# Patient Record
Sex: Female | Born: 1935 | Race: White | Hispanic: No | Smoking: Never smoker
Health system: Southern US, Community
[De-identification: ages and names within clinical notes are randomized; demographics above are authoritative.]

---

## 2015-10-23 ENCOUNTER — Emergency Department (HOSPITAL_COMMUNITY)
Admission: EM | Admit: 2015-10-23 | Discharge: 2015-10-23 | Disposition: A | Discharge status: Home / Self Care | Payer: Medicare Other | Attending: Emergency Medicine | Admitting: Emergency Medicine

## 2015-10-23 ENCOUNTER — Encounter (HOSPITAL_COMMUNITY): Payer: Self-pay | Admitting: Emergency Medicine

## 2015-10-23 DIAGNOSIS — Y9301 Activity, walking, marching and hiking: Secondary | ICD-10-CM | POA: Diagnosis not present

## 2015-10-23 DIAGNOSIS — Y998 Other external cause status: Secondary | ICD-10-CM | POA: Insufficient documentation

## 2015-10-23 DIAGNOSIS — Y9289 Other specified places as the place of occurrence of the external cause: Secondary | ICD-10-CM | POA: Insufficient documentation

## 2015-10-23 DIAGNOSIS — S0990XA Unspecified injury of head, initial encounter: Secondary | ICD-10-CM | POA: Diagnosis present

## 2015-10-23 DIAGNOSIS — W01198A Fall on same level from slipping, tripping and stumbling with subsequent striking against other object, initial encounter: Secondary | ICD-10-CM | POA: Insufficient documentation

## 2015-10-23 DIAGNOSIS — T148XXA Other injury of unspecified body region, initial encounter: Secondary | ICD-10-CM

## 2015-10-23 DIAGNOSIS — S0191XA Laceration without foreign body of unspecified part of head, initial encounter: Secondary | ICD-10-CM | POA: Insufficient documentation

## 2015-10-23 DIAGNOSIS — W19XXXA Unspecified fall, initial encounter: Secondary | ICD-10-CM

## 2015-10-23 MED ORDER — HYDROCODONE-ACETAMINOPHEN 5-325 MG PO TABS: 1.0000 | ORAL_TABLET | Freq: Once | ORAL | Status: DC

## 2015-10-23 NOTE — ED Notes (Signed)
Pt brought in by EMS reports that she tripped and fell today at [facility] and struck the top of her head on a metal rack. Denies LOC, neck or back pain. Pt ambulatory. Denies dizzy.

## 2015-10-23 NOTE — ED Notes (Signed)
Bed: WA27 Expected date:  Expected time:  Means of arrival:  Comments: EMS-fall

## 2015-10-23 NOTE — ED Provider Notes (Signed)
  Face-to-face evaluation   History: Patient was in a store, tripped on something lying on the floor, fell backwards and scanned the top forehead on a metal rack. She did not lose consciousness. She is ambulatory. She came here with someone that matter in the store and is helping her.  Physical exam: Alert, elderly female who is comfortable. She has a superficial abrasion of the vertex of the scalp. It is not actively bleeding. There is no associated swelling or crepitation. She is moving all extremities equally. The patient is lucid.     Medical screening examination/treatment/procedure(s) were conducted as a shared visit with non-physician practitioner(s) and myself.  I personally evaluated the patient during the encounter  Mancel Bale, MD 10/23/15 1616

## 2015-10-23 NOTE — ED Provider Notes (Signed)
CSN: [id number]     Arrival date & time 10/23/15  1358 History  By signing my name below, I, Tanda Rockers, attest that this documentation has been prepared under the direction and in the presence of [employer], PA-C. Electronically Signed: Tanda Rockers, ED Scribe. 10/23/2015. 2:36 PM.   Chief Complaint  Patient presents with  . Fall  . Head Laceration   The history is provided by the patient. No language interpreter was used.    HPI Comments: Lori Oconnor is a 82 y.o. female brought in by ambulance, who presents to the Emergency Department complaining of sudden onset, constant, stinging sensation to the top of the head s/p ground level fall that occurred today. Pt states that she was at [facility] when she slipped on silicone packets on the floor, causing her to fall and hit her head on a metal rack. No LOC. Pt is able to ambulate without difficulty. Denies numbness, weakness, visual changes, chest pain, shortness of breath, abdominal pain, headache, nausea, vomiting, or any other associated symptoms. Pt is not on anticoagulants. UTD on tetanus. No hx DM or immunocompromised.    History reviewed. No pertinent past medical history. History reviewed. No pertinent past surgical history. No family history on file. Social History  Substance Use Topics  . Smoking status: Never Smoker   . Smokeless tobacco: None  . Alcohol Use: No   OB History    No data available     Review of Systems  A complete 10 system review of systems was obtained and all systems are negative except as noted in the HPI and PMH.   Allergies  Review of patient's allergies indicates no known allergies.  Home Medications   Prior to Admission medications   Not on File   BP 157/76 mmHg  Pulse 85  Temp(Src) 98.5 F (36.9 C) (Oral)  Resp 18  SpO2 95%   Physical Exam  Constitutional: She is oriented to person, place, and time. She appears well-developed and well-nourished. No distress.  HENT:  Head:  Normocephalic.  Small skin tear to apex of head approximately 1.5 cm  Hemostasis achieved  Eyes: Conjunctivae and EOM are normal.  Neck: Neck supple. No tracheal deviation present.     Cardiovascular: Normal rate, regular rhythm and normal heart sounds.   Pulmonary/Chest: Effort normal and breath sounds normal. No respiratory distress. She has no wheezes. She has no rales.  Musculoskeletal: Normal range of motion. She exhibits no tenderness.  No C, T, or L tenderness Full active ROM   Neurological: She is alert and oriented to person, place, and time.  Gait is baseline.  Normal finger to nose.  No pronator drift.  Motor strength and sensation are baseline.  Skin: Skin is warm and dry.  Psychiatric: She has a normal mood and affect. Her behavior is normal.  Nursing note and vitals reviewed.   ED Course  Procedures (including critical care time)  DIAGNOSTIC STUDIES: Oxygen Saturation is 95% on RA, adequate by my interpretation.    COORDINATION OF CARE: 2:32 PM-Discussed treatment plan which includes follow up with PCP with pt at bedside and pt agreed to plan.   Labs Review Labs Reviewed - No data to display  Imaging Review No results found.   EKG Interpretation None      Meds given in ED:  Medications - No data to display  There are no discharge medications for this patient.  Filed Vitals:   10/23/15 1404  BP: 157/76  Pulse: 85  Temp: 98.5 F (36.9 C)  Resp: 18     MDM  Lori Oconnor is a 82 y.o. female with no significant past medical history comes in for evaluation after a mechanical fall. Patient reports she was walking around [facility] when she slipped on some silicone packs and hit her head on a metal clothes rack. She denies any LOC, headache, vision changes, dizziness, nausea or vomiting, numbness or weakness. She is asymptomatic in the emergency department. She does not take anticoagulation. She is up-to-date on Tdap. She has a small skin tear at the  apex of her head, otherwise she has a normal physical exam, nonfocal neuro exam. Appears very well and has follow-up with a PCP in [facility]. Discussed strict return precautions and she verbalizes understanding and agrees with this plan. Prior to discharge, I discussed and reviewed this case with my attending, Dr. Effie Shy who also saw and evaluated the patient and agrees with above plan. Final diagnoses:  Fall, initial encounter  Skin abrasion   I personally performed the services described in this documentation, which was scribed in my presence. The recorded information has been reviewed and is accurate.      Joycie Peek, PA-C 10/23/15 1531  Mancel Bale, MD 10/23/15 [id number]

## 2015-10-23 NOTE — Discharge Instructions (Signed)
You were evaluated in the ED today after fall. There does not appear to be any emergent symptoms related to or causes for your fall. Please follow-up with your doctor for reevaluation as needed. Return to ED for any new or worsening symptoms as we discussed. Continue taking Tylenol for discomfort

## 2020-06-13 ENCOUNTER — Emergency Department (HOSPITAL_COMMUNITY)
Admission: EM | Admit: 2020-06-13 | Discharge: 2020-06-13 | Disposition: A | Discharge status: Home / Self Care | Payer: Medicare Other | Attending: Emergency Medicine | Admitting: Emergency Medicine

## 2020-06-13 ENCOUNTER — Emergency Department (HOSPITAL_COMMUNITY): Payer: Medicare Other

## 2020-06-13 ENCOUNTER — Encounter (HOSPITAL_COMMUNITY): Payer: Self-pay

## 2020-06-13 DIAGNOSIS — S0990XA Unspecified injury of head, initial encounter: Secondary | ICD-10-CM | POA: Diagnosis present

## 2020-06-13 DIAGNOSIS — S0181XA Laceration without foreign body of other part of head, initial encounter: Secondary | ICD-10-CM | POA: Diagnosis not present

## 2020-06-13 DIAGNOSIS — S61511A Laceration without foreign body of right wrist, initial encounter: Secondary | ICD-10-CM | POA: Diagnosis not present

## 2020-06-13 DIAGNOSIS — W101XXA Fall (on)(from) sidewalk curb, initial encounter: Secondary | ICD-10-CM | POA: Diagnosis not present

## 2020-06-13 DIAGNOSIS — W19XXXA Unspecified fall, initial encounter: Secondary | ICD-10-CM

## 2020-06-13 DIAGNOSIS — S61411A Laceration without foreign body of right hand, initial encounter: Secondary | ICD-10-CM | POA: Diagnosis not present

## 2020-06-13 DIAGNOSIS — R0789 Other chest pain: Secondary | ICD-10-CM | POA: Diagnosis not present

## 2020-06-13 DIAGNOSIS — S0083XA Contusion of other part of head, initial encounter: Secondary | ICD-10-CM

## 2020-06-13 NOTE — ED Provider Notes (Signed)
[facility]-EMERGENCY DEPT Provider Note   CSN: [id number] Arrival date & time: 06/13/20  1249     History Chief Complaint  Patient presents with  . Fall    Lori Oconnor is a 86 y.o. female.   Fall This is a new problem. The current episode started 1 to 2 hours ago. The problem has not changed since onset.Pertinent negatives include no chest pain, no headaches and no shortness of breath. Nothing aggravates the symptoms. Nothing relieves the symptoms. She has tried nothing for the symptoms. The treatment provided mild relief.       History reviewed. No pertinent past medical history.  There are no problems to display for this patient.   History reviewed. No pertinent surgical history.   OB History   No obstetric history on file.     History reviewed. No pertinent family history.  Social History   Tobacco Use  . Smoking status: Never Smoker  Substance Use Topics  . Alcohol use: No  . Drug use: Not on file    Home Medications Prior to Admission medications   Not on File    Allergies    Patient has no known allergies.  Review of Systems   Review of Systems  Constitutional: Negative for chills and fever.  HENT: Negative for congestion and rhinorrhea.   Respiratory: Negative for cough and shortness of breath.   Cardiovascular: Negative for chest pain and palpitations.  Gastrointestinal: Negative for diarrhea, nausea and vomiting.  Genitourinary: Negative for difficulty urinating and dysuria.  Musculoskeletal: Negative for arthralgias and back pain.  Skin: Positive for wound. Negative for rash.  Neurological: Negative for light-headedness and headaches.  Hematological: Bruises/bleeds easily.    Physical Exam Updated Vital Signs BP (!) 177/56 (BP Location: Right Arm)   Pulse 77   Temp 98 F (36.7 C) (Oral)   Resp 18   Ht 5\' 3"  (1.6 m)   Wt 59 kg   SpO2 94%   BMI 23.03 kg/m   Physical Exam Vitals and nursing note  reviewed. Exam conducted with a chaperone present.  Constitutional:      General: She is not in acute distress.    Appearance: Normal appearance.  HENT:     Head: Normocephalic.      Comments: Normal range of motion of the jaw, no malocclusion, able to hold a popsicle stick in her teeth while biting down me pulling    Nose: No rhinorrhea.  Eyes:     General:        Right eye: No discharge.        Left eye: No discharge.     Conjunctiva/sclera: Conjunctivae normal.  Cardiovascular:     Rate and Rhythm: Normal rate and regular rhythm.  Pulmonary:     Effort: Pulmonary effort is normal. No respiratory distress.     Breath sounds: No stridor.  Abdominal:     General: Abdomen is flat. There is no distension.     Palpations: Abdomen is soft.  Musculoskeletal:        General: No tenderness or signs of injury.     Comments: Scattered abrasion and superficial laceration of the right hand on the dorsal surface, as well as wrist. All hemostatic only through epidermis,  Skin:    General: Skin is warm and dry.  Neurological:     General: No focal deficit present.     Mental Status: She is alert. Mental status is at baseline.     Motor: No  weakness.     Comments: 5 out of 5 motor strength in all extremities, sensation intact throughout, no dysmetria, no dysdiadochokinesia, no ataxia with ambulation, cranial nerves II through XII intact, alert and oriented to person place and time   Psychiatric:        Mood and Affect: Mood normal.        Behavior: Behavior normal.     ED Results / Procedures / Treatments   Labs (all labs ordered are listed, but only abnormal results are displayed) Labs Reviewed - No data to display  EKG None  Radiology DG Chest 2 View  Result Date: 06/13/2020 CLINICAL DATA:  Fall EXAM: CHEST - 2 VIEW COMPARISON:  08/25/2019 FINDINGS: The heart size and mediastinal contours are within normal limits. Both lungs are clear. The visualized skeletal structures are  unremarkable. Atherosclerotic aortic arch. IMPRESSION: No active cardiopulmonary disease. Electronically Signed   By: Marlan Palau M.D.   On: 06/13/2020 16:43   CT Head Wo Contrast  Result Date: 06/13/2020 CLINICAL DATA:  Facial trauma. Additional provided: Fall, head pain, laceration above left eye. EXAM: CT HEAD WITHOUT CONTRAST TECHNIQUE: Contiguous axial images were obtained from the base of the skull through the vertex without intravenous contrast. COMPARISON:  No pertinent prior exams are available for comparison. FINDINGS: Brain: Moderate generalized parenchymal atrophy. Advanced ill-defined hypoattenuation within the cerebral white matter is nonspecific, but consistent with chronic small vessel ischemic disease. There is no acute intracranial hemorrhage. No demarcated cortical infarct. No extra-axial fluid collection. No evidence of intracranial mass. No midline shift. Vascular: No hyperdense vessel.  Atherosclerotic calcifications. Skull: Normal. Negative for fracture or focal lesion. Sinuses/Orbits: Prominent left periorbital soft tissue swelling/hematoma. Additionally, there is a small focus of subcutaneous gas overlying the anterior left zygomatic arch. No significant paranasal sinus disease or mastoid effusion IMPRESSION: No evidence of acute intracranial abnormality. Prominent left periorbital soft tissue swelling/hematoma. Small focus of subcutaneous gas overlying the anterior left zygomatic arch. Moderate generalized atrophy of the brain with advanced cerebral white matter chronic small vessel ischemic disease. Electronically Signed   By: Jackey Loge DO   On: 06/13/2020 16:59    Procedures Procedures (including critical care time)  Medications Ordered in ED Medications - No data to display  ED Course  I have reviewed the triage vital signs and the nursing notes.  Pertinent labs & imaging results that were available during my care of the patient were reviewed by me and considered in  my medical decision making (see chart for details).    MDM Rules/Calculators/A&P                          Mechanical fall, no LOC, no blood thinners, will scan head will get chest x-ray, complains of left chest discomfort but no tenderness to palpation deformity. Complains of mild headache around the injury but no altered mental status or neurologic dysfunction. Will clear her C-spine clinically with no motion tenderness no midline tenderness normal range of motion, no altered mental status or distracting injuries. Will clean wounds and dressed superficially.  CT head and chest x-ray reviewed by myself and radiology show no acute fracture cardiopulmonary disease or intracranial process.  The patient may have a small zygomatic injury however it is nondisplaced and she has no significant tenderness of the face, no intraocular entrapment.  She is safe for discharge home, she is ambulated here with no difficulty.  Our social worker will help her get home  as her family is out of town.  Her wounds were cleaned and dressed by myself at bedside Final Clinical Impression(s) / ED Diagnoses Final diagnoses:  Fall, initial encounter  Facial hematoma, initial encounter  Facial laceration, initial encounter    Rx / DC Orders ED Discharge Orders    None       Sabino Donovan, MD 06/13/20 1755

## 2020-06-13 NOTE — ED Triage Notes (Signed)
Pt presents via EMS with c/o fall. Pt reports she might have fall on the curb but is not exactly sure what caused her to fall. When staff at the business came out to her, she was laying prone. Pt is alert and oriented, c/o pain in her head, laceration above her left eye, skin tears to fingers.

## 2021-02-16 IMAGING — CT CT HEAD W/O CM
3 series · 15 of 47 positions shown, 18 images · non-contrast
Comparison: No pertinent prior exams are available for comparison.

CLINICAL DATA: Facial trauma. Additional provided: Fall, head pain,
laceration above left eye.

EXAM:
CT HEAD WITHOUT CONTRAST
TECHNIQUE: Contiguous axial images were obtained from the base of the skull
through the vertex without intravenous contrast.

[Series 2: head wo · axial · 0.42mm/px · z∈[-104,+31]mm · 9 of 33 slices shown, 12 images]
[im 3/33  brain]
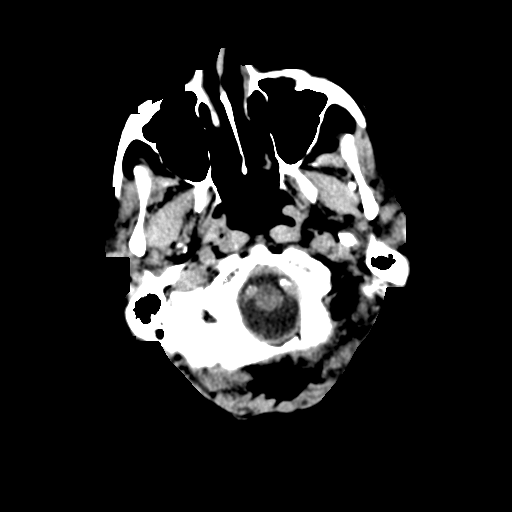
[im 3/33  bone]
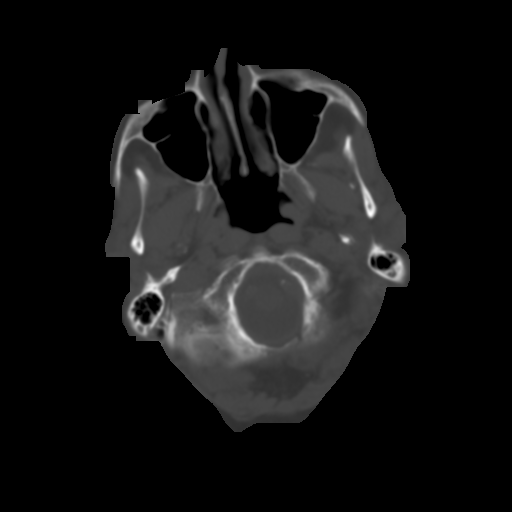
[im 6/33  brain]
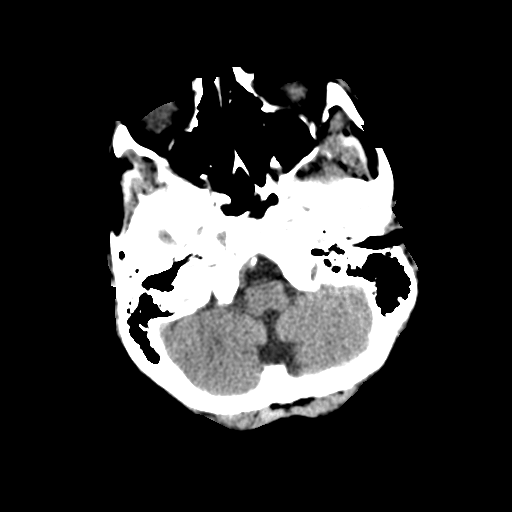
[im 9/33  brain]
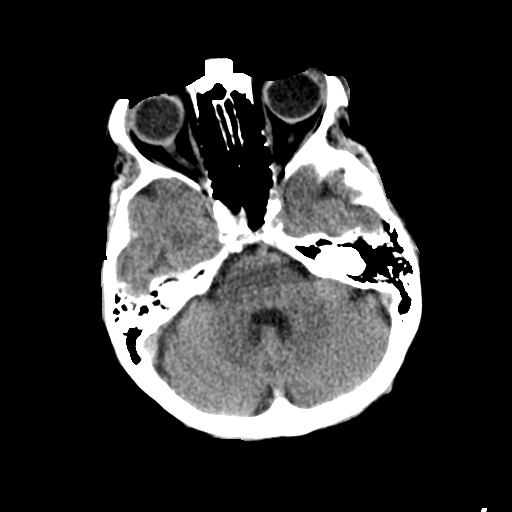
[im 13/33  brain]
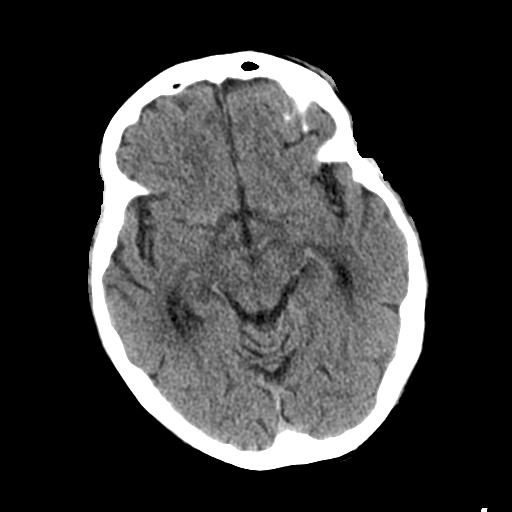
[im 17/33  brain]
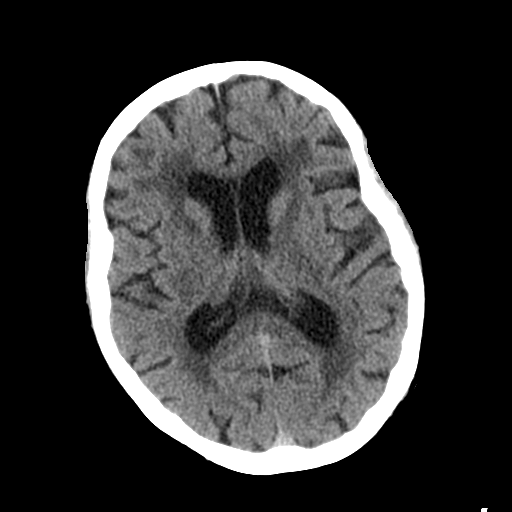
[im 17/33  bone]
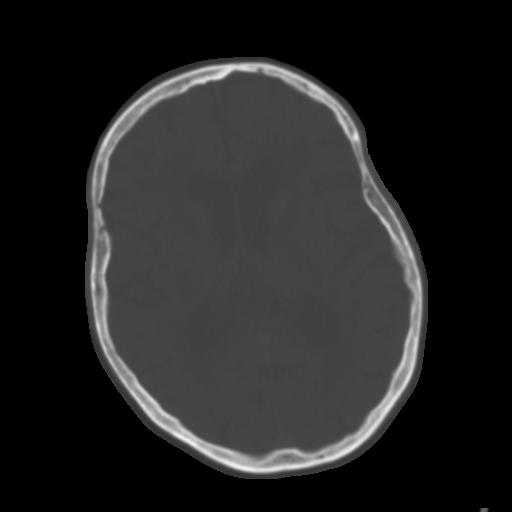
[im 20/33  brain]
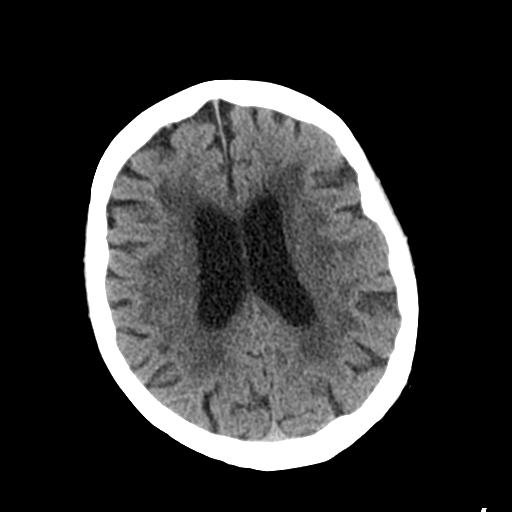
[im 24/33  brain]
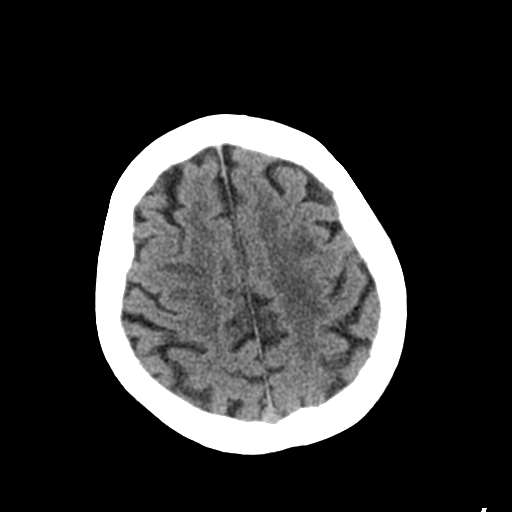
[im 27/33  brain]
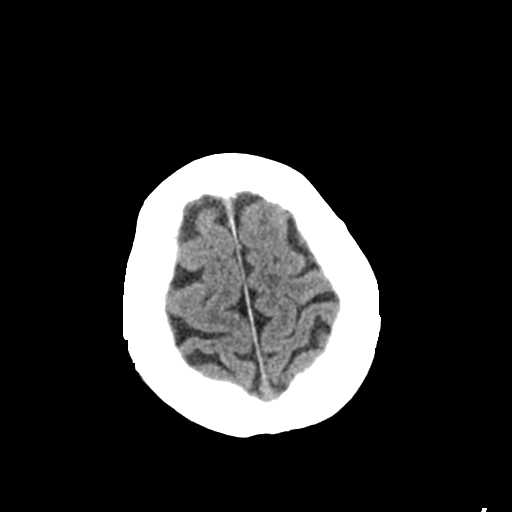
[im 30/33  brain]
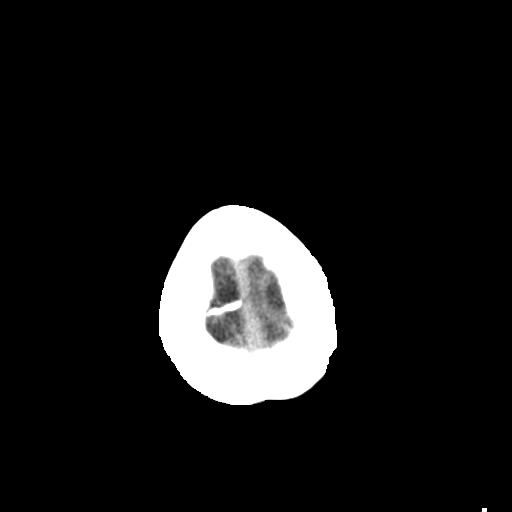
[im 30/33  bone]
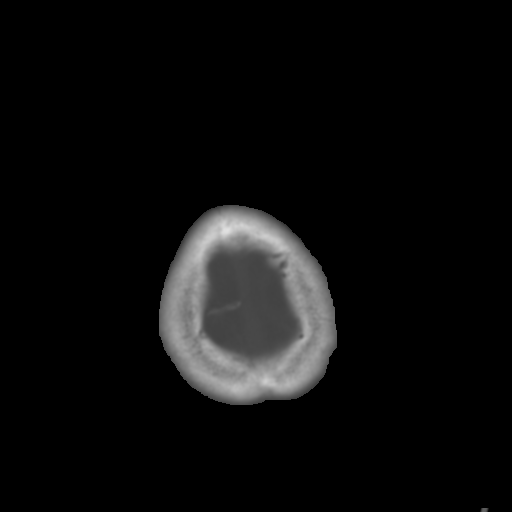

[Series 4: coronal soft tissue · coronal · 0.29mm/px · 3 of 63 slices shown]
[im 22/63  brain]
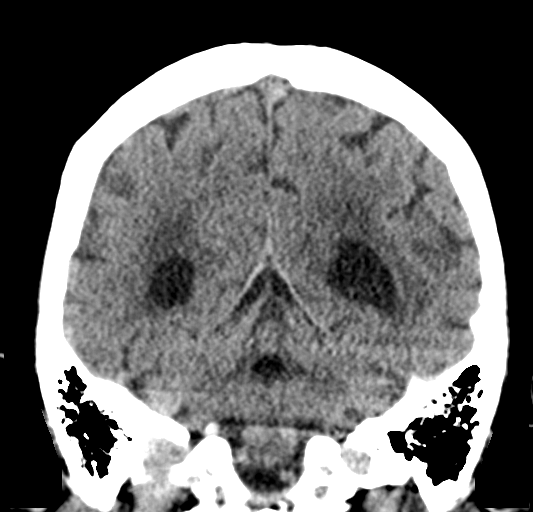
[im 28/63  brain]
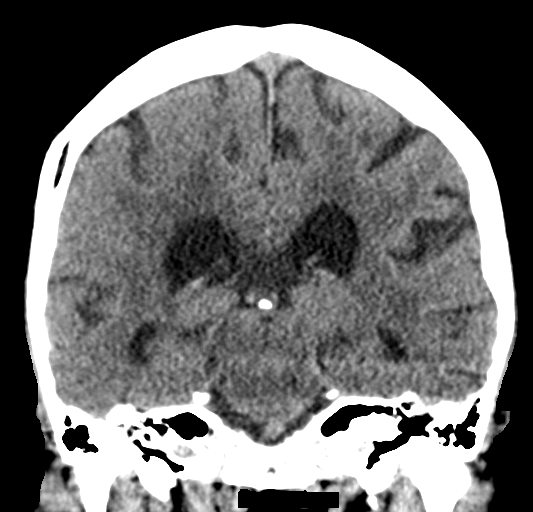
[im 35/63  brain]
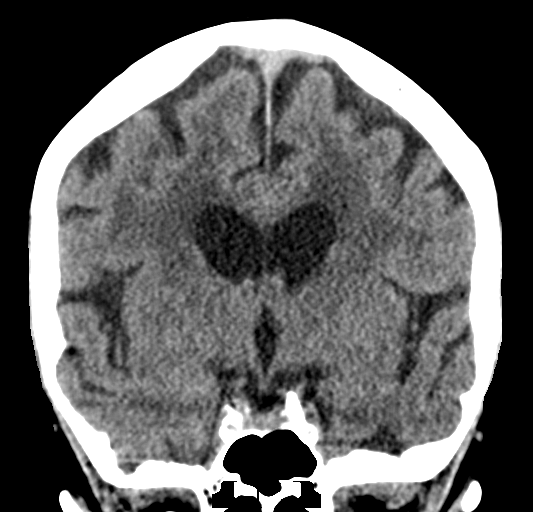

[Series 5: sagittal soft tissue · sagittal · 0.29mm/px · 3 of 52 slices shown]
[im 18/52  brain]
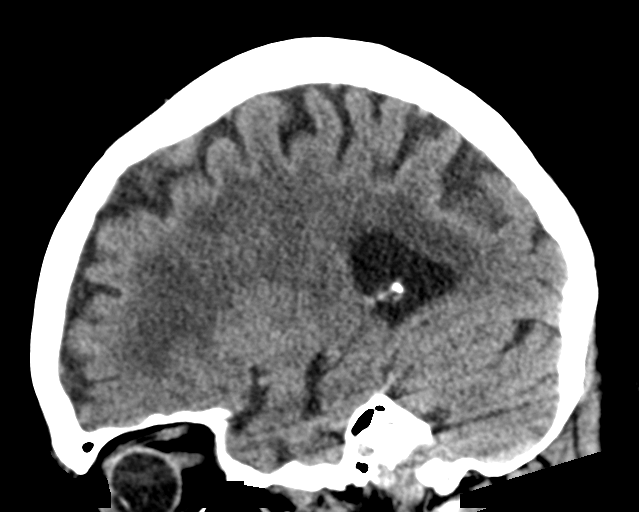
[im 26/52  brain]
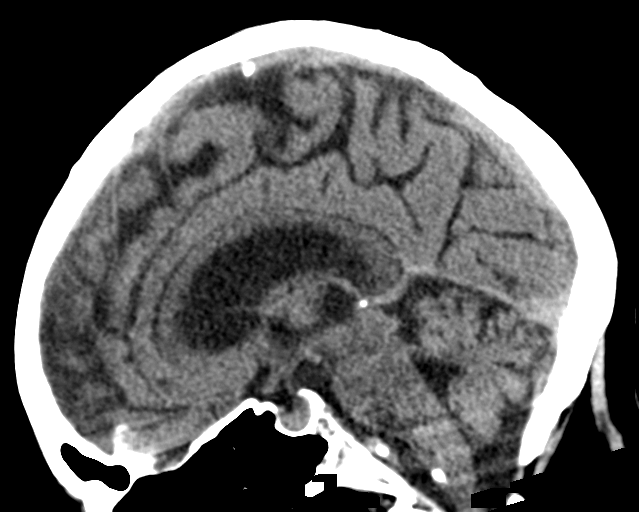
[im 35/52  brain]
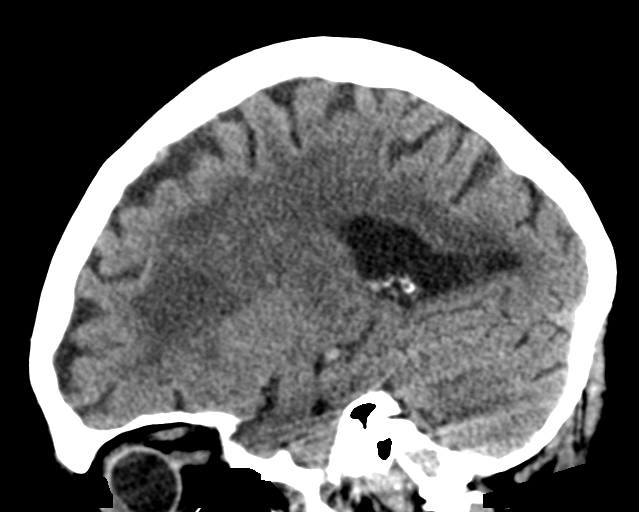

[15 of 47 positions shown; findings below may reference images not displayed]

FINDINGS: Brain:

Moderate generalized parenchymal atrophy.

Advanced ill-defined hypoattenuation within the cerebral white
matter is nonspecific, but consistent with chronic small vessel
ischemic disease.

There is no acute intracranial hemorrhage.

No demarcated cortical infarct.

No extra-axial fluid collection.

No evidence of intracranial mass.

No midline shift.

Vascular: No hyperdense vessel.  Atherosclerotic calcifications.

Skull: Normal. Negative for fracture or focal lesion.

Sinuses/Orbits: Prominent left periorbital soft tissue
swelling/hematoma. Additionally, there is a small focus of
subcutaneous gas overlying the anterior left zygomatic arch. No
significant paranasal sinus disease or mastoid effusion
IMPRESSION: No evidence of acute intracranial abnormality.

Prominent left periorbital soft tissue swelling/hematoma.

Small focus of subcutaneous gas overlying the anterior left
zygomatic arch.

Moderate generalized atrophy of the brain with advanced cerebral
white matter chronic small vessel ischemic disease.
